# Patient Record
Sex: Male | Born: 1985 | Race: White | Hispanic: Yes | Marital: Single | Smoking: Current every day smoker
Health system: Southern US, Community
[De-identification: ages and names within clinical notes are randomized; demographics above are authoritative.]

---

## 2014-12-08 ENCOUNTER — Emergency Department (HOSPITAL_COMMUNITY)
Admission: EM | Admit: 2014-12-08 | Discharge: 2014-12-08 | Disposition: A | Discharge status: Home / Self Care | Payer: Self-pay | Attending: Emergency Medicine | Admitting: Emergency Medicine

## 2014-12-08 ENCOUNTER — Encounter (HOSPITAL_COMMUNITY): Payer: Self-pay | Admitting: Emergency Medicine

## 2014-12-08 ENCOUNTER — Emergency Department (HOSPITAL_COMMUNITY): Payer: Self-pay

## 2014-12-08 DIAGNOSIS — R0789 Other chest pain: Secondary | ICD-10-CM | POA: Insufficient documentation

## 2014-12-08 DIAGNOSIS — Z880 Allergy status to penicillin: Secondary | ICD-10-CM | POA: Insufficient documentation

## 2014-12-08 DIAGNOSIS — Z87891 Personal history of nicotine dependence: Secondary | ICD-10-CM | POA: Insufficient documentation

## 2014-12-08 LAB — CBC
HEMATOCRIT: 41.8 % (ref 39.0–52.0)
Hemoglobin: 14 g/dL (ref 13.0–17.0)
MCH: 29 pg (ref 26.0–34.0)
MCHC: 33.5 g/dL (ref 30.0–36.0)
MCV: 86.5 fL (ref 78.0–100.0)
Platelets: 219 10*3/uL (ref 150–400)
RBC: 4.83 MIL/uL (ref 4.22–5.81)
RDW: 13.2 % (ref 11.5–15.5)
WBC: 7.2 10*3/uL (ref 4.0–10.5)

## 2014-12-08 LAB — BASIC METABOLIC PANEL
ANION GAP: 7 (ref 5–15)
BUN: 14 mg/dL (ref 6–20)
CHLORIDE: 104 mmol/L (ref 101–111)
CO2: 26 mmol/L (ref 22–32)
Calcium: 9.1 mg/dL (ref 8.9–10.3)
Creatinine, Ser: 0.81 mg/dL (ref 0.61–1.24)
GFR calc Af Amer: >60 mL/min (ref >60)
GFR calc non Af Amer: >60 mL/min (ref >60)
Glucose, Bld: 83 mg/dL (ref 65–99)
Potassium: 4.3 mmol/L (ref 3.5–5.1)
SODIUM: 137 mmol/L (ref 135–145)

## 2014-12-08 LAB — I-STAT TROPONIN, ED
TROPONIN I, POC: 0.01 ng/mL (ref 0.00–0.08)
Troponin i, poc: 0 ng/mL (ref 0.00–0.08)

## 2014-12-08 MED ORDER — ONDANSETRON 4 MG PO TBDP
4.0000 mg | ORAL_TABLET | Freq: Once | ORAL | Status: AC
  Administered 2014-12-08: 4 mg via ORAL
  Filled 2014-12-08: qty 1

## 2014-12-08 MED ORDER — CYCLOBENZAPRINE HCL 10 MG PO TABS
10.0000 mg | ORAL_TABLET | Freq: Once | ORAL | Status: AC
  Administered 2014-12-08: 10 mg via ORAL
  Filled 2014-12-08: qty 1

## 2014-12-08 MED ORDER — KETOROLAC TROMETHAMINE 60 MG/2ML IM SOLN
60.0000 mg | Freq: Once | INTRAMUSCULAR | Status: AC
  Administered 2014-12-08: 60 mg via INTRAMUSCULAR
  Filled 2014-12-08: qty 2

## 2014-12-08 NOTE — ED Provider Notes (Signed)
CSN: [id number]     Arrival date & time 12/08/14  0912 History   First MD Initiated Contact with Patient 12/08/14 1025     Chief Complaint  Patient presents with  . Chest Pain     (Consider location/radiation/quality/duration/timing/severity/associated sxs/prior Treatment) Patient is a 29 y.o. male presenting with chest pain.  Chest Pain Pain location:  R chest and L chest Pain quality: crushing and sharp   Duration:  2 weeks Timing:  Intermittent (lasts ) Relieved by:  Certain positions (laying on right side with arm behind) Worsened by:  Movement Associated symptoms: no abdominal pain, no back pain, no cough, no diaphoresis, no fever, no headache, no nausea, no numbness, no shortness of breath and not vomiting   Risk factors: smoking   Risk factors: no coronary artery disease, no diabetes mellitus, no high cholesterol, no hypertension, no prior DVT/PE and no surgery   Risk factors comment:  Past drug use, has not used in over 3wk per pt   History reviewed. No pertinent past medical history. History reviewed. No pertinent past surgical history. No family history on file. History  Substance Use Topics  . Smoking status: Former Games developer  . Smokeless tobacco: Not on file  . Alcohol Use: Yes    Review of Systems  Constitutional: Negative for fever and diaphoresis.  HENT: Negative for sore throat.   Eyes: Negative for visual disturbance.  Respiratory: Negative for cough and shortness of breath.   Cardiovascular: Positive for chest pain.  Gastrointestinal: Negative for nausea, vomiting and abdominal pain.  Genitourinary: Negative for difficulty urinating.  Musculoskeletal: Negative for back pain and neck stiffness.  Skin: Negative for rash.  Neurological: Negative for syncope, numbness and headaches.      Allergies  Penicillins  Home Medications   Prior to Admission medications   Not on File   BP 102/65 mmHg  Pulse 60  Temp(Src) 98.1 F (36.7 C) (Oral)  Resp  16  Ht  (1.575 m)  Wt 141 lb 5 oz (64.099 kg)  BMI 25.84 kg/m2  SpO2 100% Physical Exam  Constitutional: He is oriented to person, place, and time. He appears well-developed and well-nourished. No distress.  HENT:  Head: Normocephalic and atraumatic.  Eyes: Conjunctivae and EOM are normal.  Neck: Normal range of motion.  Cardiovascular: Normal rate, regular rhythm, normal heart sounds and intact distal pulses.  Exam reveals no gallop and no friction rub.   No murmur heard. Pulmonary/Chest: Effort normal and breath sounds normal. No respiratory distress. He has no wheezes. He has no rales. He exhibits tenderness (left and right chest).  Abdominal: Soft. He exhibits no distension. There is no tenderness. There is no guarding.  Musculoskeletal: He exhibits no edema.  Neurological: He is alert and oriented to person, place, and time.  Skin: Skin is warm and dry. He is not diaphoretic.  Nursing note and vitals reviewed.   ED Course  Procedures (including critical care time) Labs Review Labs Reviewed  BASIC METABOLIC PANEL  CBC  I-STAT TROPOININ, ED  Rosezena Sensor, ED    Imaging Review Dg Chest 2 View  12/08/2014   CLINICAL DATA:  Right-sided chest pain for 2-3 weeks  EXAM: CHEST - 2 VIEW  COMPARISON:  None.  FINDINGS: The heart size and mediastinal contours are within normal limits. Both lungs are clear. The visualized skeletal structures are unremarkable.  IMPRESSION: No active disease.   Electronically Signed   By: Alcide Clever M.D.   On: 12/08/2014 10:20  EKG Interpretation   Date/Time:  Saturday December 08 2014 09:18:44 EDT Ventricular Rate:  89 PR Interval:  126 QRS Duration: 86 QT Interval:  350 QTC Calculation: 425 R Axis:   85 Text Interpretation:  Normal sinus rhythm Normal ECG No previous ECGs  available Confirmed by [facility] MD, Sargun Rummell ([id number]) on 12/08/2014 2:13:22 PM      MDM   Final diagnoses:  Musculoskeletal chest pain   29 year old male with  no significant medical history presents with concern of chest pain. Differential diagnosis for chest pain includes pulmonary embolus, pneumonia, ACS, myocarditis, pericarditis.  EKG was done and evaluate by me and showed no acute ST changes and no signs of pericarditis. Chest x-ray was done and evaluated by me and radiology and showed just sign of pneumonia or pneumothorax. Patient is PERC negative and low risk Wells and have low suspicion for PE.  Patient is low risk heart score and had delta troponins which were both negative. Given this evaluation, history and physical have low suspicion for pulmonary embolus, pneumonia, ACS, myocarditis, pericarditis, dissection. Patient most likely with musculoskeletal chest pain given pain with palpation as well as her movements. Given Toradol and flexeril for pain in the ED.  Patient discharged in stable condition with understanding of reasons to return and recommend PCP follow up.   Alvira Monday, MD 12/08/14 2117

## 2014-12-08 NOTE — ED Notes (Signed)
Pt. States pain is still 6/10 when "tensing up arms." Pt. States at rest he is pain free which is an improvement from previously when pain was 7/10 at rest.

## 2014-12-08 NOTE — ED Notes (Signed)
Troponin drawn by Paramedic Student Lanora Manis.

## 2014-12-08 NOTE — ED Notes (Signed)
Pt. Has pain with palpation to chest.

## 2014-12-08 NOTE — Discharge Instructions (Signed)
°Emergency Department Resource Guide °1) Find a Doctor and Pay Out of Pocket °Although you won't have to find out who is covered by your insurance plan, it is a good idea to ask around and get recommendations. You will then need to call the office and see if the doctor you have chosen will accept you as a new patient and what types of options they offer for patients who are self-pay. Some doctors offer discounts or will set up payment plans for their patients who do not have insurance, but you will need to ask so you aren't surprised when you get to your appointment. ° °2) Contact Your Local Health Department °Not all health departments have doctors that can see patients for sick visits, but many do, so it is worth a call to see if yours does. If you don't know where your local health department is, you can check in your phone book. The CDC also has a tool to help you locate your state's health department, and many state websites also have listings of all of their local health departments. ° °3) Find a Walk-in Clinic °If your illness is not likely to be very severe or complicated, you may want to try a walk in clinic. These are popping up all over the country in pharmacies, drugstores, and shopping centers. They're usually staffed by nurse practitioners or physician assistants that have been trained to treat common illnesses and complaints. They're usually fairly quick and inexpensive. However, if you have serious medical issues or chronic medical problems, these are probably not your best option. ° °No Primary Care Doctor: °- Call Health Connect at  832-8000 - they can help you locate a primary care doctor that  accepts your insurance, provides certain services, etc. °- Physician Referral Service- 1-800-533-3463 ° °Chronic Pain Problems: °Organization         Address  Phone   Notes  °[facility]  (336) 297-2271 Patients need to be referred by their primary care doctor.  ° °Medication  Assistance: °Organization         Address  Phone   Notes  °[place] [facility] [address] °[place], [place] [address] (336) 641-8030 --Must be a resident of [place] °-- Must have NO insurance coverage whatsoever (no Medicaid/ Medicare, etc.) °-- The pt. MUST have a primary care doctor that directs their care regularly and follows them in the community °  °[facility]  (866) 331-1348   °[employer]  (888) 892-1162   ° °Agencies that provide inexpensive medical care: °Organization         Address  Phone   Notes  °[facility]  (336) 832-8035   °[facility]    (336) 832-7272   °[facility] [address] °[place], [place] [address] (336) 832-4777   °[facility] [address], °[place] (336) 271-4999   °[facility]    (336) 373-0678   °[facility]    (336) 272-1050   °[facility] ° [address], [place] Phone:  (336) 832-4444, Fax:  (336) 832-4440 Hours of Operation:  9 am - 6 pm, M-F.  Also accepts Medicaid/Medicare and self-pay.  °[facility] ° [address], [place] Phone: (336) 832-3150, Fax: (336) [phone]. Hours of Operation:  8:30 am - 5:30 pm, M-F.  Also accepts Medicaid and self-pay.  °[facility] [place] [address], [place] Phone: (336) 878-6027   °[facility] [address], [place], [place] (336)723-1848, Ext. 123 Mondays & Thursdays: 7-9 AM.  First 15 patients are seen on a first come, first serve basis. °  ° °Medicaid-accepting [place] Providers: ° °Organization         Address  Phone   Notes  °[facility] [address], [place] (336) 641-2100 Also accepts self-pay patients.  °[facility] [address], [place] ° (336) 856-9996   °[facility] [address], [place]  (336) 288-8857   °[facility] [address] [place] [address], [place] (336) 299-7000   °Veita Bland [address], [place]  ° (336) 373-1557 Only accepts [place] Access Medicaid patients after they have their name applied to their card.  ° °Self-Pay (no insurance) in [place]: ° °Organization         Address  Phone   Notes  °Sickle Cell Patients, [facility] [address], [place] (336) 832-1970   °[facility] [address], [place] (336) 832-4400   °[facility] ° [address], [place] (336) 992-4800   °[facility]/Dr. Osei-Bonsu ° [address] [place] [address], [place] or [address], [place] (336) [phone] Phone number for both [place] and [place] locations is the same.  °[facility] [address], [place] (336) 299-0000   °[facility] [address] [place] [address], [place] or [address] (336) 852-7530 °(336) 878-2260   °[facility] [address], [place] (336) 350-1642, phone; (336) 294-5005, fax Sees patients 1st and 3rd Saturday of every month.  Must not qualify for public or private insurance (i.e. Medicaid, Medicare, [place] Health Choice, Veterans' Benefits) • Household income should be no more than 200% of the poverty level •The clinic cannot treat you if you are pregnant or think you are pregnant • Sexually transmitted diseases are not treated at the clinic.  ° ° °Dental Care: °Organization         Address  Phone  Notes  °[place] [facility] [address], [place] (336) 641-6152 Accepts children up to age 21 who are enrolled in Medicaid or [place] Health Choice; pregnant women with a Medicaid card; and children who have applied for Medicaid or [place] Health Choice, but were declined, whose parents can pay a reduced fee at time of service.  °[place]  [facility] [place]  [address], [place] (336) 641-7733 Accepts children up to age 21 who are enrolled in Medicaid or [place] Health Choice; pregnant women with a Medicaid card; and children who have applied for Medicaid or [place] Health Choice, but were declined, whose parents can pay a reduced fee at time of service.  °[facility] ° [address], [place] (336) 641-4533 Patients are seen by appointment only. Walk-ins are not accepted. [facility] will see patients 18 years of age and older. °Monday - Tuesday (8am-5pm) °Most Wednesdays (8:30-5pm) °$30 per visit, cash only  °[facility] ° [address], [place] (336) 641-4533 Patients are seen by appointment only. Walk-ins are not accepted. [facility] will see patients 18 years of age and older. °One   Wednesday Evening (Monthly: Volunteer Based).  $30 per visit, cash only  °[facility]  (919) 537-3737 for adults; Children under age 4, call Graduate Pediatric Dentistry at (919) [phone]. Children aged 4-14, please call (919) [phone] to request a pediatric application. ° Dental services are provided in all areas of dental care including fillings, crowns and bridges, complete and partial dentures, implants, gum treatment, root canals, and extractions. Preventive care is also provided. Treatment is provided to both adults and children. °Patients are selected via a lottery and there is often a waiting list. °  °[facility] [address], °[place] ° (336) 763-8833 [email or web address] °  °[facility] [address], [place], [place] (336)723-1848, Ext. 123 Second and Fourth Thursday of each month, opens at 6:30 AM; Clinic ends at 9 AM.  Patients are seen on a first-come first-served basis, and a limited number are seen during each clinic.  ° °[facility] ° [address], [place], [place] (336) 723-7904    Eligibility Requirements °You must have lived in [place], [place], or [place] counties for at least the last three months. °  You cannot be eligible for state or federal sponsored healthcare insurance, including Veterans Administration, Medicaid, or Medicare. °  You generally cannot be eligible for healthcare insurance through your employer.  °  How to apply: °Eligibility screenings are held every Tuesday and Wednesday afternoon from 1:00 pm until 4:00 pm. You do not need an appointment for the interview!  °[facility] [address], [place], [place] 336-631-2330   °[facility]  336-342-8273   °[facility]  336-703-3100   °[facility]  336-570-6415   ° °Behavioral Health Resources in the Community: °Intensive Outpatient Programs °Organization         Address  Phone  Notes  °[facility] [address], [place], [place] 336-878-6098   °[facility] [address], [place], [place] 336-832-9800   °[facility] [address], [place], [place] ° 336-882-2125   °[facility] [address],  °[place], [place] 1-800-853-5163 or 336-641-4981   °Substance Abuse Resources °Organization         Address  Phone  Notes  °[facility]  336-882-2125   °[facility]  336-784-9470   °[facility]  336-285-9073   °[facility]  336-845-3988   °Residential & Outpatient Substance Abuse Program  1-800-659-3381   °Psychological Services °Organization         Address  Phone  Notes  °[facility]  336- 832-9600   °[facility]  336- 378-7881   °[facility] [address], [place] 1-800-853-5163 or 336-641-4981   ° °Mobile Crisis Teams °Organization         Address  Phone  Notes  °[facility], Mobile Crisis Care Unit  1-877-626-1772   °Assertive °[facility] ° [address]  [place], [place] 336-834-9664   °Sharon DeEsch [address] °[place] 336-554-5454   ° °Self-Help/Support Groups °Organization         Address  Phone             Notes  °[facility]  - variety of support groups  336- 373-1402 Call for more information  °Narcotics Anonymous (NA), [facility] [address], °[place]  2 meetings at this location  ° °  Residential Treatment Programs Organization         Address  Phone  Notes  [facility] [address]    [place]  7-829-562-1308   [facility]  [address] [place] [address], [place], [place] 962-952-8413   [facility] [address] [place], [place] 244-010-2725 Admissions: 8am-3pm M-F  [facility] [address]    [place], [place] 366-440-3474   [facility] [address] [place], [place], [place] 259-563-8756   [facility] [address],  [place], [place] 433-295-1884   [facility] Intensive Outpatient [address], [place], [place] 166-063-0160   [facility] [address] [place]  [place], [place] 1-093-235-5732 or 609-212-5231   [facility] [address], [place], [place] 376-283-1517 Accepts Medicaid  [facility] [address],  [place] 6-160-737-1062 Substance Abuse/Addiction Treatment   [facility] Organization         Address  Phone  Notes  [facility]  432-793-9984   Angie Fava, PhD [address]vin Knack [place], [place]   (303)800-0629 or 267-623-3882   [facility]   [address] [place], [place] (239)405-3840   [facility] [address], [place], [place] (404)782-0531 Insurance/Medicaid/sponsorship through [facility] [address]                                    [place], [place] 204-732-0525 Therapy/tele-psych/case    [facility] [address], [place] 615-677-9487    Dr. Lolly Mustache  623-556-7756   [facility]  [employer] [facility] 1) [address], [place] 2) [address] [place] 3)  [address], [place] 774-152-3223 5747936200  (317)557-0902   [facility] Child Abuse Hotline 681-847-8218 or (423)656-5043 (After Hours)       Chest Wall Pain Chest wall pain is pain in or around the bones and muscles of your chest. It may take up to 6 weeks to get better. It may take longer if you must stay physically active in your work and activities.  CAUSES  Chest wall pain may happen on its own. However, it may be caused by:  A viral illness like the flu.  Injury.  Coughing.  Exercise.  Arthritis.  Fibromyalgia.  Shingles. HOME CARE INSTRUCTIONS   Avoid overtiring physical activity. Try not to strain or perform activities that cause pain. This includes any activities using your chest or your abdominal and side muscles, especially if heavy weights are used.  Put ice on the sore area.  Put ice in a plastic bag.  Place a towel between your skin and the bag.  Leave the ice on for 15-20 minutes per hour while awake for the first 2 days.  Only take over-the-counter or prescription medicines for pain, discomfort, or fever as directed by your caregiver. SEEK IMMEDIATE MEDICAL CARE IF:   Your pain increases, or you are very uncomfortable.  You have a fever.  Your chest pain becomes worse.  You have new, unexplained symptoms.  You have nausea or vomiting.  You feel sweaty or lightheaded.  You have a cough with phlegm (sputum), or you cough up  blood. MAKE SURE YOU:   Understand these instructions.  Will watch your condition.  Will get help right away if you are not doing well or get worse. Document Released: 05/04/2005 Document Revised: 07/27/2011 Document Reviewed: 12/29/2010 [facility] Patient  Information 2015 [place], [place]. This information is not intended to replace advice given to you by your health care provider. Make sure you discuss any questions you have with your health care provider.

## 2014-12-08 NOTE — ED Notes (Signed)
Pt. Stated, I started having chest pain about 2 weeks ago and its getting worse.

## 2016-06-05 IMAGING — CR DG CHEST 2V
2 series · 2 of 2 positions shown · non-contrast
Comparison: None.

CLINICAL DATA: Right-sided chest pain for 2-3 weeks

EXAM:
CHEST - 2 VIEW

[chest pa]
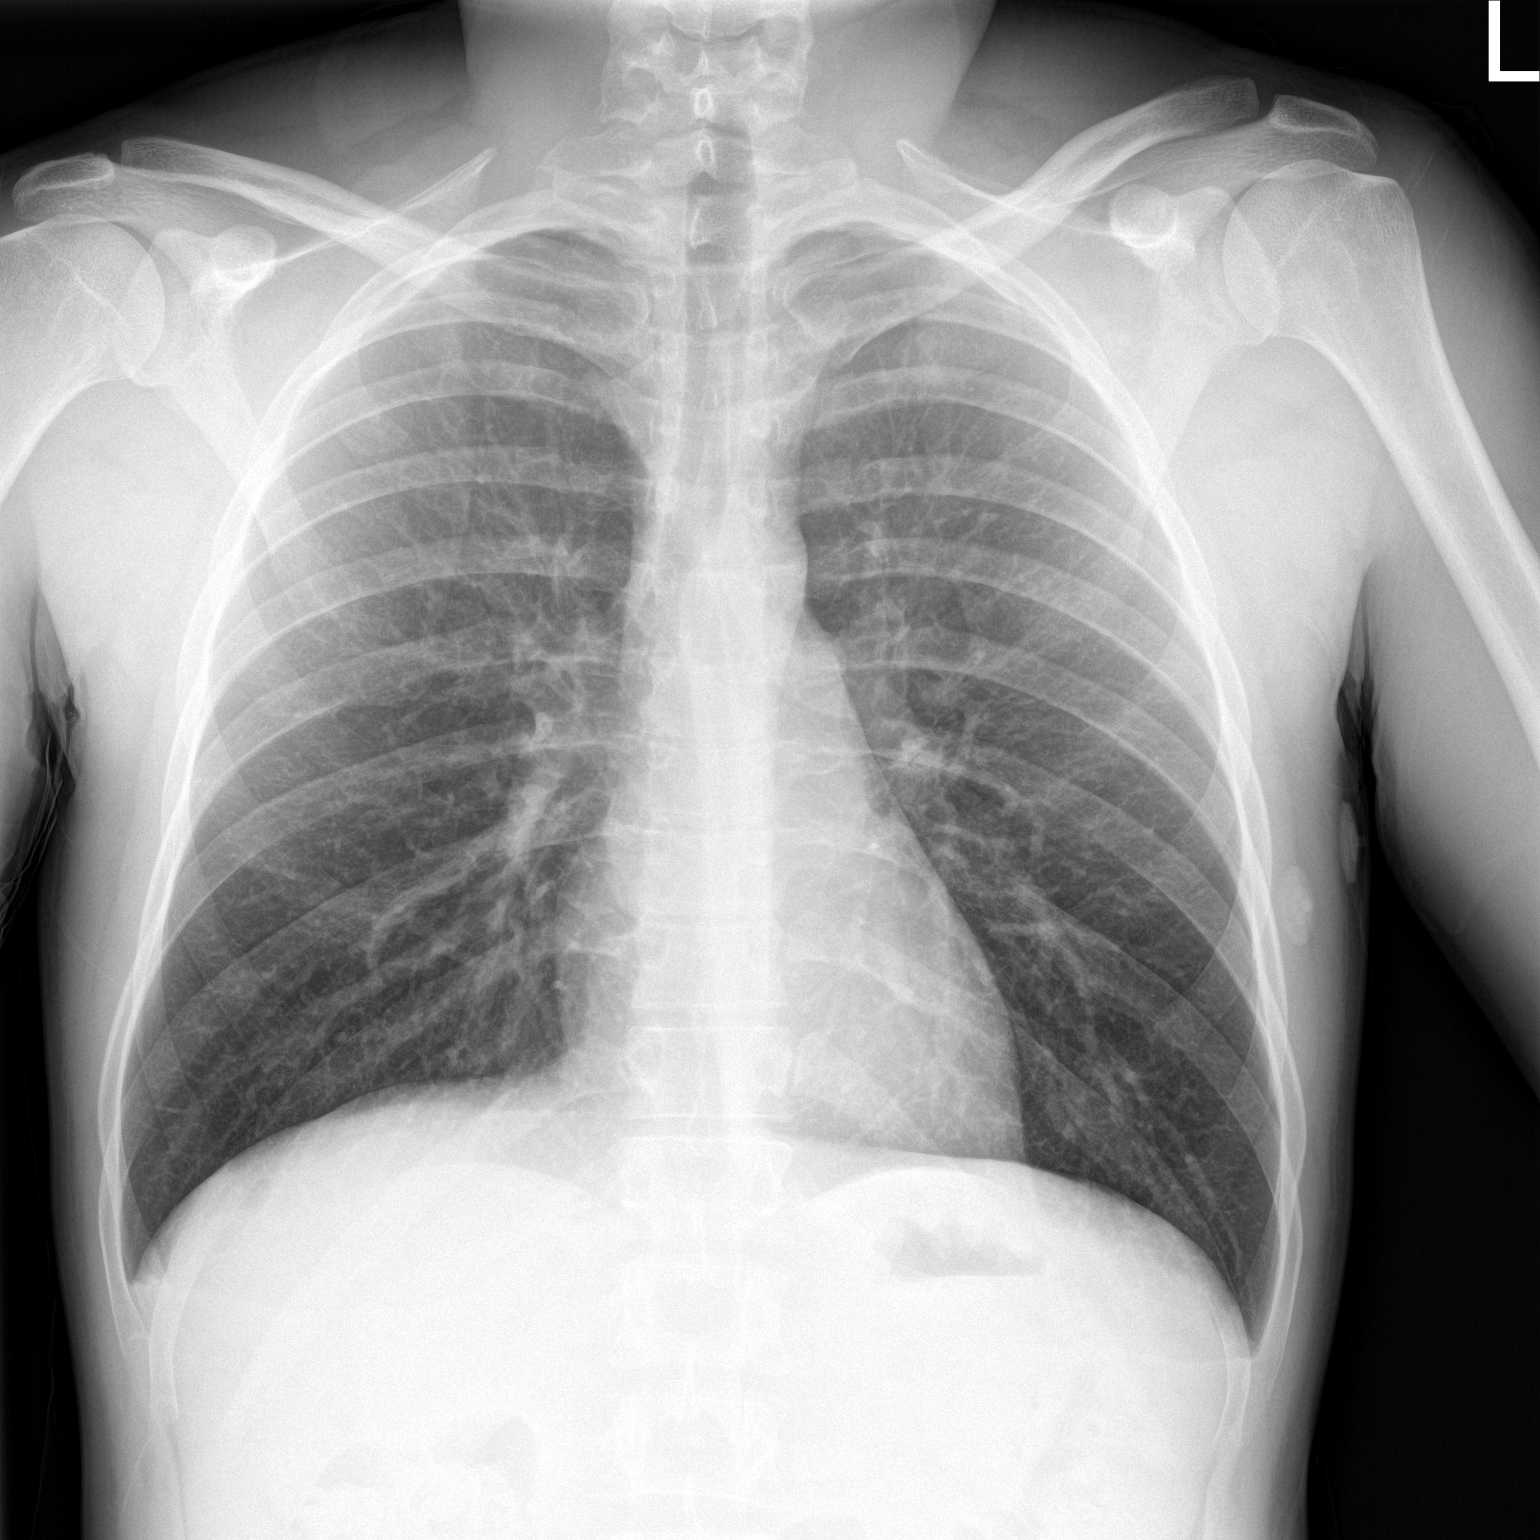

[chest lat]
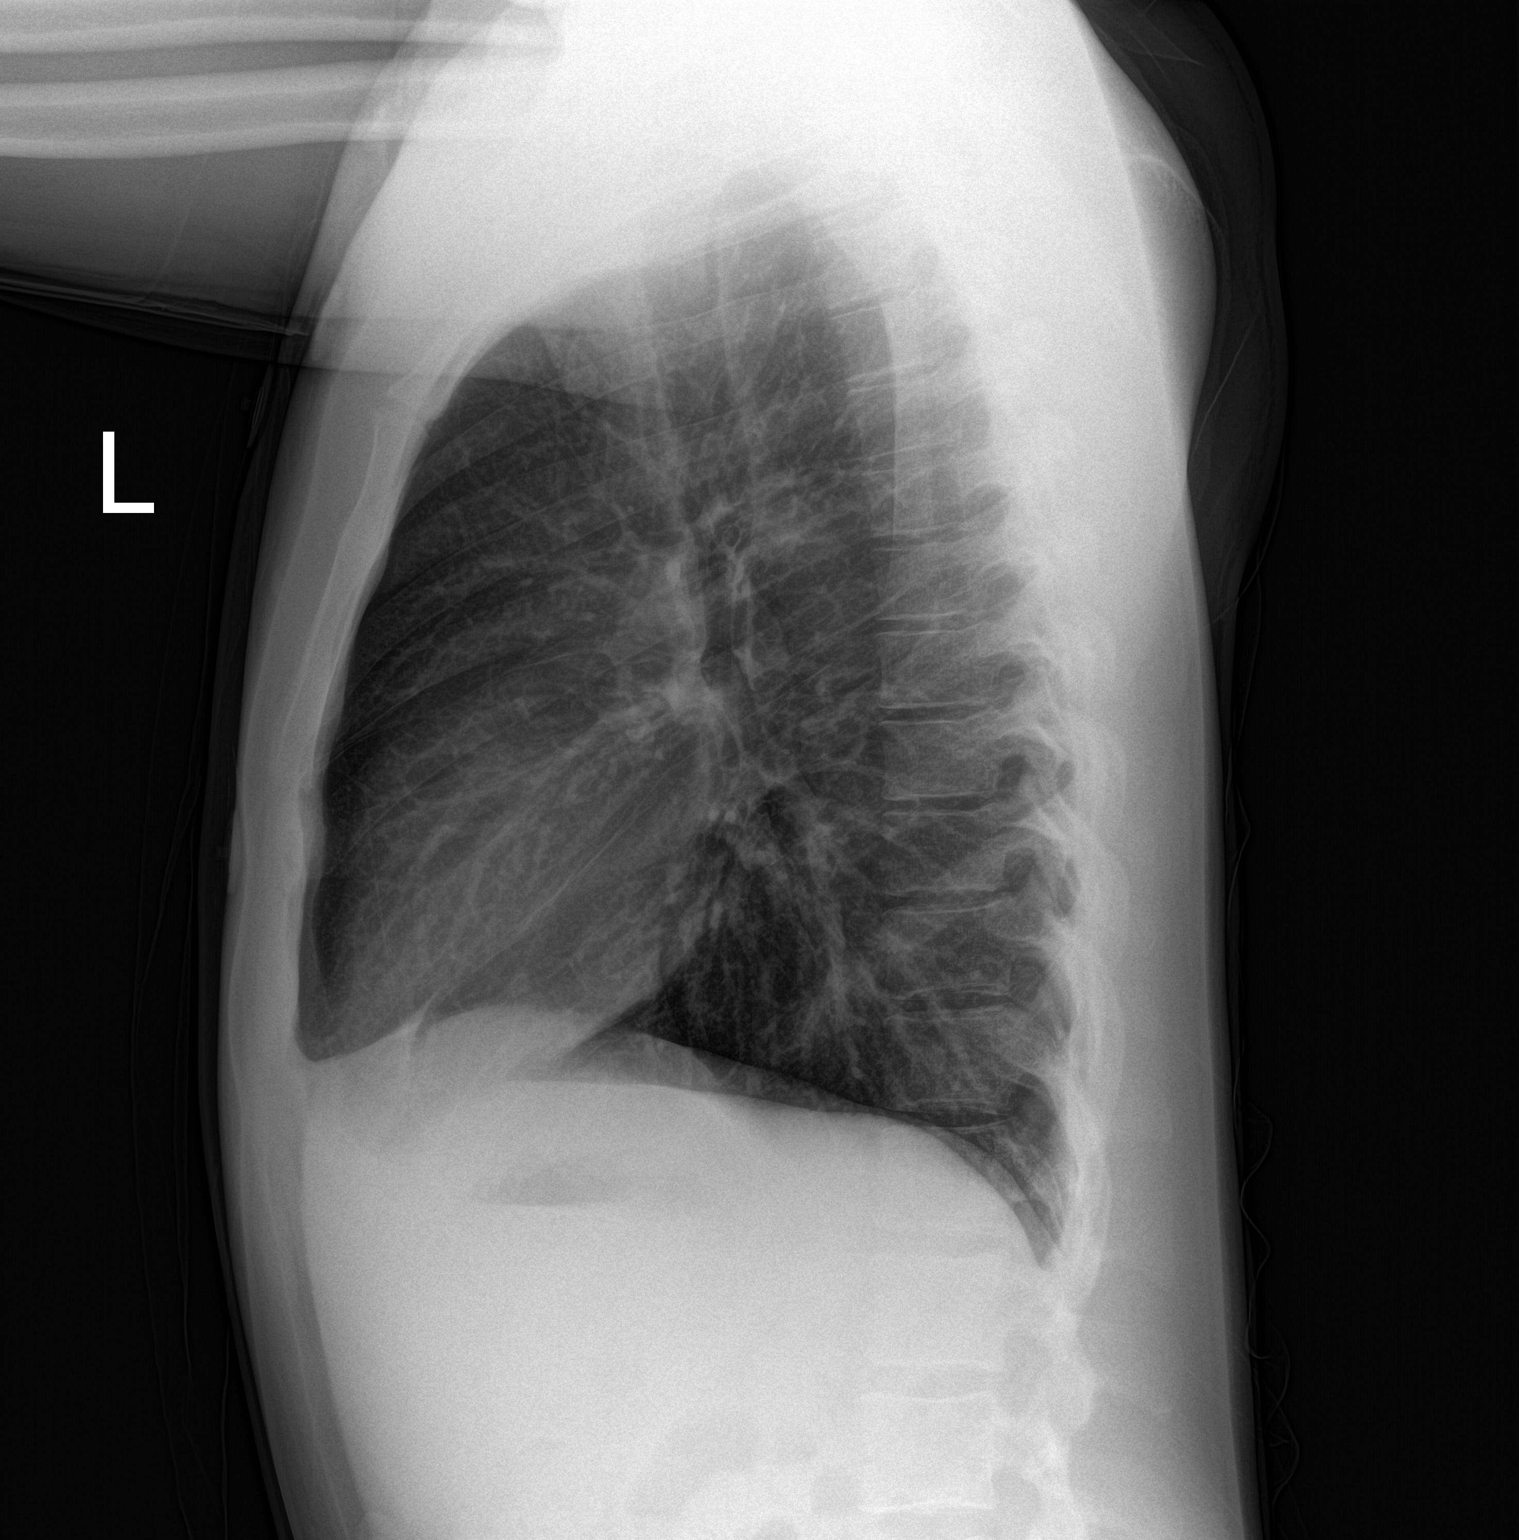

[2 of 2 positions shown; findings below may reference images not displayed]

FINDINGS: The heart size and mediastinal contours are within normal limits.
Both lungs are clear. The visualized skeletal structures are
unremarkable.
IMPRESSION: No active disease.

## 2017-10-17 ENCOUNTER — Encounter (HOSPITAL_BASED_OUTPATIENT_CLINIC_OR_DEPARTMENT_OTHER): Payer: Self-pay | Admitting: Emergency Medicine

## 2017-10-17 ENCOUNTER — Other Ambulatory Visit: Payer: Self-pay

## 2017-10-17 ENCOUNTER — Emergency Department (HOSPITAL_BASED_OUTPATIENT_CLINIC_OR_DEPARTMENT_OTHER)
Admission: EM | Admit: 2017-10-17 | Discharge: 2017-10-17 | Disposition: A | Discharge status: Home / Self Care | Payer: Self-pay | Attending: Emergency Medicine | Admitting: Emergency Medicine

## 2017-10-17 DIAGNOSIS — Z5321 Procedure and treatment not carried out due to patient leaving prior to being seen by health care provider: Secondary | ICD-10-CM | POA: Insufficient documentation

## 2017-10-17 DIAGNOSIS — M545 Low back pain: Secondary | ICD-10-CM | POA: Insufficient documentation

## 2017-10-17 NOTE — ED Triage Notes (Signed)
L low back pain x 1 week. Pt states he pushed a truck before it started hurting.

## 2017-10-18 NOTE — ED Notes (Signed)
Follow up call made   1250  10/18/17  s Aleric Froelich rn
